# Patient Record
Sex: Female | Born: 1995 | Race: Black or African American | Hispanic: No | Marital: Single | Smoking: Never smoker
Health system: Southern US, Community
[De-identification: ages and names within clinical notes are randomized; demographics above are authoritative.]

---
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
## 2014-02-24 ENCOUNTER — Encounter (HOSPITAL_COMMUNITY): Payer: Self-pay | Admitting: *Deleted

## 2014-02-24 ENCOUNTER — Emergency Department (HOSPITAL_COMMUNITY)
Admission: EM | Admit: 2014-02-24 | Discharge: 2014-02-25 | Disposition: A | Discharge status: Home / Self Care | Payer: Medicaid Other | Attending: Emergency Medicine | Admitting: Emergency Medicine

## 2014-02-24 ENCOUNTER — Emergency Department (HOSPITAL_COMMUNITY): Payer: Medicaid Other

## 2014-02-24 DIAGNOSIS — R42 Dizziness and giddiness: Secondary | ICD-10-CM | POA: Insufficient documentation

## 2014-02-24 DIAGNOSIS — R000 Tachycardia, unspecified: Secondary | ICD-10-CM | POA: Insufficient documentation

## 2014-02-24 DIAGNOSIS — R05 Cough: Secondary | ICD-10-CM | POA: Diagnosis present

## 2014-02-24 DIAGNOSIS — R531 Weakness: Secondary | ICD-10-CM

## 2014-02-24 DIAGNOSIS — J069 Acute upper respiratory infection, unspecified: Secondary | ICD-10-CM | POA: Diagnosis not present

## 2014-02-24 LAB — CBC
HCT: 39.2 % (ref 36.0–46.0)
HEMOGLOBIN: 12.7 g/dL (ref 12.0–15.0)
MCH: 28.3 pg (ref 26.0–34.0)
MCHC: 32.4 g/dL (ref 30.0–36.0)
MCV: 87.5 fL (ref 78.0–100.0)
Platelets: 195 10*3/uL (ref 150–400)
RBC: 4.48 MIL/uL (ref 3.87–5.11)
RDW: 12.8 % (ref 11.5–15.5)
WBC: 5.5 10*3/uL (ref 4.0–10.5)

## 2014-02-24 NOTE — ED Provider Notes (Signed)
CSN: [id number]     Arrival date & time 02/24/14  2144 History   This chart was scribed for non-physician practitioner Elpidio Anis, PA-C, working with Purvis Sheffield, MD by Evon Slack, ED Scribe. This patient was seen in room WTR5/WTR5 and the patient's care was started at 10:50 PM.      Chief Complaint  Patient presents with  . Headache  . URI   The history is provided by the patient. No language interpreter was used.   HPI Comments: Melanie Mason is a 19 y.o. female who presents to the Emergency Department complaining of gradual pressure headache onset tonight. Pt states that for the past 3 weeks she has been feeling fatigues and dizzy when standing. Pt states she has also had cough, congestion and cold sweats for the past 3 weeks that have worsened over the past week. Pt states that she feels SOB after coughing for long periods of time descried as "cughing spell."  Pt states she has tried mucinex with no relief.   Denies ear pain, rhinorrhea, trouble swallowing, abdominal pain, n/v/d urinary symptoms.   History reviewed. No pertinent past medical history. History reviewed. No pertinent past surgical history. No family history on file. History  Substance Use Topics  . Smoking status: Never Smoker   . Smokeless tobacco: Not on file  . Alcohol Use: No   OB History    No data available     Review of Systems  Constitutional: Positive for fatigue.  HENT: Positive for congestion. Negative for rhinorrhea and trouble swallowing.   Respiratory: Positive for cough.   Gastrointestinal: Negative for nausea, vomiting, abdominal pain, diarrhea and constipation.  Neurological: Positive for dizziness and headaches.  All other systems reviewed and are negative.     Allergies  Review of patient's allergies indicates no known allergies.  Home Medications   Prior to Admission medications   Medication Sig Start Date End Date Taking? Authorizing Provider  etonogestrel (NEXPLANON)  68 MG IMPL implant 1 each by Subdermal route continuous.   Yes Historical Provider, MD   BP 130/87 mmHg  Pulse 110  Temp(Src) 99.5 F (37.5 C) (Oral)  Resp 18  SpO2 100%  LMP 02/18/2014   Physical Exam  Constitutional: She is oriented to person, place, and time. She appears well-developed and well-nourished. No distress.  HENT:  Head: Normocephalic and atraumatic.  Right Ear: Tympanic membrane normal. Tympanic membrane is not injected, not erythematous and not bulging.  Left Ear: Tympanic membrane normal. Tympanic membrane is not injected, not erythematous and not bulging.  Nose: Nose normal.  Mouth/Throat: Oropharynx is clear and moist. No oropharyngeal exudate, posterior oropharyngeal erythema or tonsillar abscesses.  Eyes: EOM are normal. Pupils are equal, round, and reactive to light. No scleral icterus.  Conjunctival pallor   Neck: Neck supple. No tracheal deviation present.  Cardiovascular: Regular rhythm.  Tachycardia present.  Exam reveals no gallop and no friction rub.   No murmur heard. Pulmonary/Chest: Effort normal and breath sounds normal. No respiratory distress. She has no wheezes. She has no rales.  Abdominal: Soft. There is no tenderness. There is no CVA tenderness.  Musculoskeletal: Normal range of motion.  Neurological: She is alert and oriented to person, place, and time. She has normal strength. No cranial nerve deficit or sensory deficit.  Cranial nerves 3-12 grossly intact.   Skin: Skin is warm and dry.  Psychiatric: She has a normal mood and affect. Her behavior is normal.  Nursing note and vitals reviewed.  ED Course  Procedures (including critical care time) DIAGNOSTIC STUDIES: Oxygen Saturation is 100% on RA, normal by my interpretation.    COORDINATION OF CARE: 11:01 PM-Discussed treatment plan with pt at bedside and pt agreed to plan.     Labs Review Labs Reviewed - No data to display  Imaging Review No results found.   EKG  Interpretation None      MDM   Final diagnoses:  None  1. URI 2. Generalized Weakness  The patient c/o weakness, positional lightheadedness without syncope, headache - symptoms for the past 3 weeks. Feeling progressively weaker. She is non-toxic in appearance. Will obtain orthostatics, hgb to r/o anemia.  Blood tests are reassuring. Orthostatic vitals obtained and patient asymptomatic with standing. VSS. She can be discharged home with PCP follow up for further management, referrals provided. Discussed URI symptoms and will treat with Z-pack given duration of symptoms.    I personally performed the services described in this documentation, which was scribed in my presence. The recorded information has been reviewed and is accurate.       Arnoldo HookerShari A Quintez Maselli, PA-C 02/25/14 [id number]  Purvis SheffieldForrest Harrison, MD 02/26/14 1409

## 2014-02-24 NOTE — ED Notes (Signed)
Pt reports h/a, "cold sweats", cough and chest congestion x 3 weeks.  Pt reports she's been feeling tired lately.

## 2014-02-25 LAB — BASIC METABOLIC PANEL
ANION GAP: 7 (ref 5–15)
BUN: 13 mg/dL (ref 6–23)
CO2: 25 mmol/L (ref 19–32)
Calcium: 9.5 mg/dL (ref 8.4–10.5)
Chloride: 109 mEq/L (ref 96–112)
Creatinine, Ser: 0.81 mg/dL (ref 0.50–1.10)
Glucose, Bld: 79 mg/dL (ref 70–99)
Potassium: 3.6 mmol/L (ref 3.5–5.1)
Sodium: 141 mmol/L (ref 135–145)

## 2014-02-25 MED ORDER — AZITHROMYCIN 250 MG PO TABS: 250.0000 mg | ORAL_TABLET | Freq: Every day | ORAL | Status: DC

## 2014-02-25 NOTE — Discharge Instructions (Signed)
Weakness Weakness is a lack of strength. It may be felt all over the body (generalized) or in one specific part of the body (focal). Some causes of weakness can be serious. You may need further medical evaluation, especially if you are elderly or you have a history of immunosuppression (such as chemotherapy or HIV), kidney disease, heart disease, or diabetes. CAUSES  Weakness can be caused by many different things, including:  Infection.  Physical exhaustion.  Internal bleeding or other blood loss that results in a lack of red blood cells (anemia).  Dehydration. This cause is more common in elderly people.  Side effects or electrolyte abnormalities from medicines, such as pain medicines or sedatives.  Emotional distress, anxiety, or depression.  Circulation problems, especially severe peripheral arterial disease.  Heart disease, such as rapid atrial fibrillation, bradycardia, or heart failure.  Nervous system disorders, such as Guillain-Barr syndrome, multiple sclerosis, or stroke. DIAGNOSIS  To find the cause of your weakness, your caregiver will take your history and perform a physical exam. Lab tests or X-rays may also be ordered, if needed. TREATMENT  Treatment of weakness depends on the cause of your symptoms and can vary greatly. HOME CARE INSTRUCTIONS   Rest as needed.  Eat a well-balanced diet.  Try to get some exercise every day.  Only take over-the-counter or prescription medicines as directed by your caregiver. SEEK MEDICAL CARE IF:   Your weakness seems to be getting worse or spreads to other parts of your body.  You develop new aches or pains. SEEK IMMEDIATE MEDICAL CARE IF:   You cannot perform your normal daily activities, such as getting dressed and feeding yourself.  You cannot walk up and down stairs, or you feel exhausted when you do so.  You have shortness of breath or chest pain.  You have difficulty moving parts of your body.  You have weakness  in only one area of the body or on only one side of the body.  You have a fever.  You have trouble speaking or swallowing.  You cannot control your bladder or bowel movements.  You have black or bloody vomit or stools. MAKE SURE YOU:  Understand these instructions.  Will watch your condition.  Will get help right away if you are not doing well or get worse. Document Released: 01/27/2005 Document Revised: 07/29/2011 Document Reviewed: 03/28/2011 [facility] Patient Information 2015 [place], [place]. This information is not intended to replace advice given to you by your health care provider. Make sure you discuss any questions you have with your health care provider. Upper Respiratory Infection, Adult An upper respiratory infection (URI) is also sometimes known as the common cold. The upper respiratory tract includes the nose, sinuses, throat, trachea, and bronchi. Bronchi are the airways leading to the lungs. Most people improve within 1 week, but symptoms can last up to 2 weeks. A residual cough may last even longer.  CAUSES Many different viruses can infect the tissues lining the upper respiratory tract. The tissues become irritated and inflamed and often become very moist. Mucus production is also common. A cold is contagious. You can easily spread the virus to others by oral contact. This includes kissing, sharing a glass, coughing, or sneezing. Touching your mouth or nose and then touching a surface, which is then touched by another person, can also spread the virus. SYMPTOMS  Symptoms typically develop 1 to 3 days after you come in contact with a cold virus. Symptoms vary from person to person. They may include:  Runny nose.  Sneezing.  Nasal congestion.  Sinus irritation.  Sore throat.  Loss of voice (laryngitis).  Cough.  Fatigue.  Muscle aches.  Loss of appetite.  Headache.  Low-grade fever. DIAGNOSIS  You might diagnose your own cold based on familiar symptoms,  since most people get a cold 2 to 3 times a year. Your caregiver can confirm this based on your exam. Most importantly, your caregiver can check that your symptoms are not due to another disease such as strep throat, sinusitis, pneumonia, asthma, or epiglottitis. Blood tests, throat tests, and X-rays are not necessary to diagnose a common cold, but they may sometimes be helpful in excluding other more serious diseases. Your caregiver will decide if any further tests are required. RISKS AND COMPLICATIONS  You may be at risk for a more severe case of the common cold if you smoke cigarettes, have chronic heart disease (such as heart failure) or lung disease (such as asthma), or if you have a weakened immune system. The very young and very old are also at risk for more serious infections. Bacterial sinusitis, middle ear infections, and bacterial pneumonia can complicate the common cold. The common cold can worsen asthma and chronic obstructive pulmonary disease (COPD). Sometimes, these complications can require emergency medical care and may be life-threatening. PREVENTION  The best way to protect against getting a cold is to practice good hygiene. Avoid oral or hand contact with people with cold symptoms. Wash your hands often if contact occurs. There is no clear evidence that vitamin C, vitamin E, echinacea, or exercise reduces the chance of developing a cold. However, it is always recommended to get plenty of rest and practice good nutrition. TREATMENT  Treatment is directed at relieving symptoms. There is no cure. Antibiotics are not effective, because the infection is caused by a virus, not by bacteria. Treatment may include:  Increased fluid intake. Sports drinks offer valuable electrolytes, sugars, and fluids.  Breathing heated mist or steam (vaporizer or shower).  Eating chicken soup or other clear broths, and maintaining good nutrition.  Getting plenty of rest.  Using gargles or lozenges for  comfort.  Controlling fevers with ibuprofen or acetaminophen as directed by your caregiver.  Increasing usage of your inhaler if you have asthma. Zinc gel and zinc lozenges, taken in the first 24 hours of the common cold, can shorten the duration and lessen the severity of symptoms. Pain medicines may help with fever, muscle aches, and throat pain. A variety of non-prescription medicines are available to treat congestion and runny nose. Your caregiver can make recommendations and may suggest nasal or lung inhalers for other symptoms.  HOME CARE INSTRUCTIONS   Only take over-the-counter or prescription medicines for pain, discomfort, or fever as directed by your caregiver.  Use a warm mist humidifier or inhale steam from a shower to increase air moisture. This may keep secretions moist and make it easier to breathe.  Drink enough water and fluids to keep your urine clear or pale yellow.  Rest as needed.  Return to work when your temperature has returned to normal or as your caregiver advises. You may need to stay home longer to avoid infecting others. You can also use a face mask and careful hand washing to prevent spread of the virus. SEEK MEDICAL CARE IF:   After the first few days, you feel you are getting worse rather than better.  You need your caregiver's advice about medicines to control symptoms.  You develop chills, worsening shortness  of breath, or brown or red sputum. These may be signs of pneumonia.  You develop yellow or brown nasal discharge or pain in the face, especially when you bend forward. These may be signs of sinusitis.  You develop a fever, swollen neck glands, pain with swallowing, or Upson areas in the back of your throat. These may be signs of strep throat. SEEK IMMEDIATE MEDICAL CARE IF:   You have a fever.  You develop severe or persistent headache, ear pain, sinus pain, or chest pain.  You develop wheezing, a prolonged cough, cough up blood, or have a  change in your usual mucus (if you have chronic lung disease).  You develop sore muscles or a stiff neck. Document Released: 07/23/2000 Document Revised: 04/21/2011 Document Reviewed: 05/04/2013 [facility] Patient Information 2015 [place], [place]. This information is not intended to replace advice given to you by your health care provider. Make sure you discuss any questions you have with your health care provider.

## 2015-04-21 ENCOUNTER — Emergency Department (HOSPITAL_COMMUNITY): Payer: Medicaid Other

## 2015-04-21 ENCOUNTER — Encounter (HOSPITAL_COMMUNITY): Payer: Self-pay | Admitting: *Deleted

## 2015-04-21 ENCOUNTER — Emergency Department (HOSPITAL_COMMUNITY)
Admission: EM | Admit: 2015-04-21 | Discharge: 2015-04-21 | Disposition: A | Discharge status: Home / Self Care | Payer: Medicaid Other | Attending: Emergency Medicine | Admitting: Emergency Medicine

## 2015-04-21 DIAGNOSIS — Y998 Other external cause status: Secondary | ICD-10-CM | POA: Insufficient documentation

## 2015-04-21 DIAGNOSIS — S46911A Strain of unspecified muscle, fascia and tendon at shoulder and upper arm level, right arm, initial encounter: Secondary | ICD-10-CM | POA: Insufficient documentation

## 2015-04-21 DIAGNOSIS — Y9389 Activity, other specified: Secondary | ICD-10-CM | POA: Diagnosis not present

## 2015-04-21 DIAGNOSIS — S63501A Unspecified sprain of right wrist, initial encounter: Secondary | ICD-10-CM | POA: Insufficient documentation

## 2015-04-21 DIAGNOSIS — S4991XA Unspecified injury of right shoulder and upper arm, initial encounter: Secondary | ICD-10-CM | POA: Diagnosis present

## 2015-04-21 DIAGNOSIS — Y92410 Unspecified street and highway as the place of occurrence of the external cause: Secondary | ICD-10-CM | POA: Insufficient documentation

## 2015-04-21 MED ORDER — IBUPROFEN 100 MG/5ML PO SUSP: 600.0000 mg | Freq: Four times a day (QID) | ORAL | Status: DC | PRN

## 2015-04-21 MED ORDER — IBUPROFEN 100 MG/5ML PO SUSP
600.0000 mg | Freq: Once | ORAL | Status: AC
  Administered 2015-04-21: 600 mg via ORAL
  Filled 2015-04-21: qty 30

## 2015-04-21 MED ORDER — DIAZEPAM 1 MG/ML PO SOLN: 5.0000 mg | Freq: Two times a day (BID) | ORAL | Status: DC | PRN
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
## 2017-07-13 ENCOUNTER — Other Ambulatory Visit (HOSPITAL_COMMUNITY)
Admission: RE | Admit: 2017-07-13 | Discharge: 2017-07-13 | Disposition: A | Discharge status: Home / Self Care | Payer: Managed Care, Other (non HMO) | Source: Ambulatory Visit | Attending: Obstetrics and Gynecology | Admitting: Obstetrics and Gynecology

## 2017-07-13 ENCOUNTER — Other Ambulatory Visit: Payer: Self-pay | Admitting: Obstetrics and Gynecology

## 2017-07-13 DIAGNOSIS — Z124 Encounter for screening for malignant neoplasm of cervix: Secondary | ICD-10-CM | POA: Insufficient documentation

## 2017-07-14 LAB — CYTOLOGY - PAP
Chlamydia: NEGATIVE
DIAGNOSIS: NEGATIVE
Neisseria Gonorrhea: NEGATIVE

## 2017-07-16 ENCOUNTER — Other Ambulatory Visit: Payer: Self-pay

## 2017-07-16 ENCOUNTER — Ambulatory Visit (INDEPENDENT_AMBULATORY_CARE_PROVIDER_SITE_OTHER): Payer: Managed Care, Other (non HMO) | Admitting: Family Medicine

## 2017-07-16 VITALS — BP 118/64 | HR 85 | Temp 99.0°F | Ht 64.0 in | Wt 126.0 lb

## 2017-07-16 DIAGNOSIS — L74510 Primary focal hyperhidrosis, axilla: Secondary | ICD-10-CM

## 2017-07-16 MED ORDER — ALUMINUM CHLORIDE 20 % EX SOLN: Freq: Every day | CUTANEOUS | 0 refills | Status: AC

## 2017-07-16 NOTE — Patient Instructions (Addendum)
Hyperhidrosis: Topical: Apply once daily at bedtime; once excessive sweating has stopped, may decrease to once or twice weekly, or as needed. Wash treated area in the morning.     IF you received an x-ray today, you will receive an invoice from [facility]. Please contact [facility] at [phone] with questions or concerns regarding your invoice.   IF you received labwork today, you will receive an invoice from [facility]. Please contact [facility] at [phone] with questions or concerns regarding your invoice.   Our billing staff will not be able to assist you with questions regarding bills from these companies.  You will be contacted with the lab results as soon as they are available. The fastest way to get your results is to activate your My Chart account. Instructions are located on the last page of this paperwork. If you have not heard from us regarding the results in 2 weeks, please contact this office.

## 2017-07-16 NOTE — Progress Notes (Signed)
Chief Complaint  Patient presents with  . Establish Care    looking to est care. Having excessive sweating for years. Having cosmetic surgery in 7 mos for the breast will need medical clearance as well    HPI  She will be having breast augmentation in 7 months She will need medical clearance at that time  She has a history of hyperhidrosis of the armpit She reports that this has been ongoing for years She has tried mitchum and certain dry roll ons which have not helped She does not associate this with stress or anxiety She reports that this has been since puberty She feels self conscious about it     4 review of systems  No past medical history on file.  Current Outpatient Medications  Medication Sig Dispense Refill  . aluminum chloride (DRYSOL) 20 % external solution Apply topically at bedtime. Until sweating decreases then as needed 35 mL 0   No current facility-administered medications for this visit.     Allergies: No Known Allergies  No past surgical history on file.  Social History   Socioeconomic History  . Marital status: Single    Spouse name: Not on file  . Number of children: Not on file  . Years of education: Not on file  . Highest education level: Not on file  Occupational History  . Not on file  Social Needs  . Financial resource strain: Not on file  . Food insecurity:    Worry: Not on file    Inability: Not on file  . Transportation needs:    Medical: Not on file    Non-medical: Not on file  Tobacco Use  . Smoking status: Never Smoker  Substance and Sexual Activity  . Alcohol use: No  . Drug use: No  . Sexual activity: Not on file  Lifestyle  . Physical activity:    Days per week: Not on file    Minutes per session: Not on file  . Stress: Not on file  Relationships  . Social connections:    Talks on phone: Not on file    Gets together: Not on file    Attends religious service: Not on file    Active member of club or organization:  Not on file    Attends meetings of clubs or organizations: Not on file    Relationship status: Not on file  Other Topics Concern  . Not on file  Social History Narrative  . Not on file    No family history on file.   ROS Review of Systems See HPI Constitution: No fevers or chills No malaise No diaphoresis Skin: No rash or itching Eyes: no blurry vision, no double vision GU: no dysuria or hematuria Neuro: no dizziness or headaches all others reviewed and negative   Objective: Vitals:   07/16/17 1027  BP: 118/64  Pulse: 85  Temp: 99 F (37.2 C)  TempSrc: Oral  SpO2: 99%  Weight: 126 lb (57.2 kg)  Height: 5\' 4"  (1.626 m)    Physical Exam  Constitutional: She is oriented to person, place, and time. She appears well-developed and well-nourished.  HENT:  Head: Normocephalic and atraumatic.  Eyes: Conjunctivae and EOM are normal.  Neck: Normal range of motion. Neck supple. No thyromegaly present.  Cardiovascular: Normal rate, regular rhythm and normal heart sounds.  No murmur heard. Pulmonary/Chest: Effort normal and breath sounds normal. No stridor. No respiratory distress. She has no wheezes. She has no rales.  Neurological: She is  alert and oriented to person, place, and time.  Skin: Skin is warm. Capillary refill takes less than 2 seconds.  Psychiatric: She has a normal mood and affect. Her behavior is normal. Judgment and thought content normal.    Assessment and Plan Stellar was seen today for establish care.  Diagnoses and all orders for this visit:  Hyperhidrosis of axilla -     aluminum chloride (DRYSOL) 20 % external solution; Apply topically at bedtime. Until sweating decreases then as needed    Hyperhidrosis: Topical: Apply once daily at bedtime; once excessive sweating has stopped, may decrease to once or twice weekly, or as needed. Wash treated area in the morning.  Tawana Pasch A Jahnessa Vanduyn

## 2017-11-13 ENCOUNTER — Other Ambulatory Visit: Payer: Self-pay

## 2017-11-13 ENCOUNTER — Ambulatory Visit: Payer: Self-pay | Admitting: Family Medicine

## 2017-11-13 ENCOUNTER — Encounter: Payer: Self-pay | Admitting: Family Medicine

## 2017-11-13 VITALS — BP 122/78 | HR 79 | Temp 98.8°F | Ht 63.5 in | Wt 127.2 lb

## 2017-11-13 DIAGNOSIS — R002 Palpitations: Secondary | ICD-10-CM

## 2017-11-13 DIAGNOSIS — R110 Nausea: Secondary | ICD-10-CM

## 2017-11-13 DIAGNOSIS — K30 Functional dyspepsia: Secondary | ICD-10-CM

## 2017-11-13 DIAGNOSIS — Z8249 Family history of ischemic heart disease and other diseases of the circulatory system: Secondary | ICD-10-CM

## 2017-11-13 DIAGNOSIS — Z30013 Encounter for initial prescription of injectable contraceptive: Secondary | ICD-10-CM

## 2017-11-13 DIAGNOSIS — R197 Diarrhea, unspecified: Secondary | ICD-10-CM

## 2017-11-13 DIAGNOSIS — R1030 Lower abdominal pain, unspecified: Secondary | ICD-10-CM

## 2017-11-13 LAB — POCT URINALYSIS DIP (MANUAL ENTRY)
Bilirubin, UA: NEGATIVE
Blood, UA: NEGATIVE
Glucose, UA: NEGATIVE mg/dL
Ketones, POC UA: NEGATIVE mg/dL
Leukocytes, UA: NEGATIVE
Nitrite, UA: NEGATIVE
Protein Ur, POC: NEGATIVE mg/dL
Spec Grav, UA: 1.015 (ref 1.010–1.025)
Urobilinogen, UA: 0.2 E.U./dL
pH, UA: 6 (ref 5.0–8.0)

## 2017-11-13 LAB — POCT URINE PREGNANCY: Preg Test, Ur: NEGATIVE

## 2017-11-13 MED ORDER — MEDROXYPROGESTERONE ACETATE 150 MG/ML IM SUSP
150.0000 mg | Freq: Once | INTRAMUSCULAR | Status: AC
  Administered 2017-11-13: 150 mg via INTRAMUSCULAR

## 2017-11-13 MED ORDER — OMEPRAZOLE 20 MG PO CPDR: 20.0000 mg | DELAYED_RELEASE_CAPSULE | Freq: Two times a day (BID) | ORAL | 0 refills | Status: DC

## 2017-11-13 NOTE — Progress Notes (Signed)
10/4/20199:26 AM  Melanie Mason Aug 03, 1995, 22 y.o. female [id number]  Chief Complaint  Patient presents with  . Pain    pain in the chest, more like pressure, sob, nausea after she eats and drinks, tachycardia    HPI:   Patient is a 22 y.o. female who presents today for palpittaions  She is having racing heartbeat, very prominent only after she eats SOB, nausea, chest pain, light headed, diaphoretic X 2 weeks Symptoms more severe is she eats something fatty Escalating, now having immediate severe stomach cramps and bowel movements Has a constant feeling upset stomach Sometimes bright red blood in the tissue, no black tarry stools No changes with diet modifications Has not tried any medications No fever, no vomiting, no bloating or belching, heartburn, cough, jaundice, dysphagia Endorses gassiness, good appetite  Mild acid brash started days prior to having palpitations and assoc sx after eating  No recent travels, camping, sick contacts, supplements, sign NSAID use No caffeine, does not smoke It reminds her when she had a stomach flu time several years ago  She does not have palpitations when physically active  Does not know if she has immunizations for hep a or b  Works at [employer]  No fhx thyroid disorder Mother has had 2 MI at 64 and CVA, heart surgery, h/o VTE, mom + smoker, OCPs Maternal grandmother also has had VTE  On OCPs, currently on day 4 of placebo pills  Fall Risk  11/13/2017 07/16/2017  Falls in the past year? No No     Depression screen [facility] 2/9 11/13/2017 07/16/2017  Decreased Interest 0 0  Down, Depressed, Hopeless 0 0  PHQ - 2 Score 0 0    No Known Allergies  Prior to Admission medications   Medication Sig Start Date End Date Taking? Authorizing Provider  aluminum chloride (DRYSOL) 20 % external solution Apply topically at bedtime. Until sweating decreases then as needed 07/16/17   Doristine Bosworth, MD    History reviewed. No pertinent  past medical history.  History reviewed. No pertinent surgical history.  Social History   Tobacco Use  . Smoking status: Never Smoker  . Smokeless tobacco: Never Used  Substance Use Topics  . Alcohol use: No    Family History  Problem Relation Age of Onset  . Stroke Mother   . Hypertension Mother   . Heart disease Mother   . Healthy Father   . Healthy Sister   . Healthy Brother     ROS Per hpi  OBJECTIVE:  Blood pressure 122/78, pulse 79, temperature 98.8 F (37.1 C), temperature source Oral, height 5' 3.5" (1.613 m), weight 127 lb 3.2 oz (57.7 kg), SpO2 100 %. Body mass index is 22.18 kg/m.   Physical Exam  Constitutional: She is oriented to person, place, and time. She appears well-developed and well-nourished.  HENT:  Head: Normocephalic and atraumatic.  Right Ear: Hearing, tympanic membrane, external ear and ear canal normal.  Left Ear: Hearing, tympanic membrane, external ear and ear canal normal.  Mouth/Throat: Oropharynx is clear and moist.  Eyes: Pupils are equal, round, and reactive to light. Conjunctivae and EOM are normal.  Neck: Neck supple. No thyromegaly present.  Cardiovascular: Normal rate, regular rhythm, normal heart sounds and intact distal pulses. Exam reveals no gallop and no friction rub.  No murmur heard. Pulmonary/Chest: Effort normal and breath sounds normal. She has no wheezes. She has no rales.  Abdominal: Soft. Bowel sounds are normal. She exhibits no distension and  no mass. There is no tenderness.  Musculoskeletal: Normal range of motion. She exhibits no edema.  Lymphadenopathy:    She has no cervical adenopathy.  Neurological: She is alert and oriented to person, place, and time. She has normal reflexes. No cranial nerve deficit. Gait normal.  Skin: Skin is warm and dry.  Psychiatric: She has a normal mood and affect.  Nursing note and vitals reviewed.  My interpretation of EKG:  NSR, HR 80, PR interval 116  Results for orders  placed or performed in visit on 11/13/17 (from the past 24 hour(s))  POCT urinalysis dipstick     Status: None   Collection Time: 11/13/17 10:25 AM  Result Value Ref Range   Color, UA yellow yellow   Clarity, UA clear clear   Glucose, UA negative negative mg/dL   Bilirubin, UA negative negative   Ketones, POC UA negative negative mg/dL   Spec Grav, UA 4.098 1.191 - 1.025   Blood, UA negative negative   pH, UA 6.0 5.0 - 8.0   Protein Ur, POC negative negative mg/dL   Urobilinogen, UA 0.2 0.2 or 1.0 E.U./dL   Nitrite, UA Negative Negative   Leukocytes, UA Negative Negative  POCT urine pregnancy     Status: None   Collection Time: 11/13/17 10:25 AM  Result Value Ref Range   Preg Test, Ur Negative Negative    ASSESSMENT and PLAN  1. Palpitations Post parandial,  associated with SOB and presyncope,  Vasovagal? Labs to r/o secondary causes. Mother with MI in her 53s, referring to  Cards for further eval and treat. RTC precautions reviewed - CBC with Differential/Platelet - Comprehensive metabolic panel - TSH - EKG 12-Lead - Ambulatory referral to Cardiology  2. Nausea without vomiting - CBC with Differential/Platelet - Comprehensive metabolic panel  3. Lower abdominal pain - CBC with Differential/Platelet - Comprehensive metabolic panel - TSH  4. Diarrhea, unspecified type - CBC with Differential/Platelet - Comprehensive metabolic panel - TSH  5. Acid indigestion Discussed with patient that sx suggestive of GI cause. Checking labs to eval secondary causes, treating empirically with PPI.  - H. pylori breath test  6. Encounter for initial prescription of injectable contraceptive Strong fhx of VTE and early ASCVD, discussed risk of OCPs, discussed options r/se/b. Transitioning to depo. Discussed need for backup method for next 7 days. Next depo due Dec 20- Jan 3 - POCT urinalysis dipstick - medroxyPROGESTERone (DEPO-PROVERA) injection 150 mg - POCT urine pregnancy  7.  FHx: early MI - EKG 12-Lead - Ambulatory referral to Cardiology  Other orders - omeprazole (PRILOSEC) 20 MG capsule; Take 1 capsule (20 mg total) by mouth 2 (two) times daily before a meal.  Return in about 2 weeks (around 11/27/2017).    Myles Lipps, MD Primary Care at [facility] [address] [place], [place] [address] Ph.  [phone] Fax [phone]

## 2017-11-13 NOTE — Patient Instructions (Addendum)
Next depo Dec 20 - Jan 3, nurse visit ok    If you have lab work done today you will be contacted with your lab results within the next 2 weeks.  If you have not heard from [place] then please contact us. The fastest way to get your results is to register for My Chart.   IF you received an x-ray today, you will receive an invoice from [facility]. Please contact [facility] at [phone] with questions or concerns regarding your invoice.   IF you received labwork today, you will receive an invoice from [place]. Please contact [facility] at [phone] with questions or concerns regarding your invoice.   Our billing staff will not be able to assist you with questions regarding bills from these companies.  You will be contacted with the lab results as soon as they are available. The fastest way to get your results is to activate your My Chart account. Instructions are located on the last page of this paperwork. If you have not heard from [place] regarding the results in 2 weeks, please contact this office.

## 2017-11-14 LAB — COMPREHENSIVE METABOLIC PANEL
ALT: 10 IU/L (ref 0–32)
AST: 17 IU/L (ref 0–40)
Albumin/Globulin Ratio: 1.5 (ref 1.2–2.2)
Albumin: 4.4 g/dL (ref 3.5–5.5)
Alkaline Phosphatase: 34 IU/L — ABNORMAL LOW (ref 39–117)
BUN/Creatinine Ratio: 9 (ref 9–23)
BUN: 8 mg/dL (ref 6–20)
Bilirubin Total: <0.2 mg/dL (ref 0.0–1.2)
CO2: 23 mmol/L (ref 20–29)
Calcium: 9.5 mg/dL (ref 8.7–10.2)
Chloride: 103 mmol/L (ref 96–106)
Creatinine, Ser: 0.9 mg/dL (ref 0.57–1.00)
GFR calc Af Amer: 104 mL/min/{1.73_m2} (ref >59)
GFR calc non Af Amer: 90 mL/min/{1.73_m2} (ref >59)
Globulin, Total: 3 g/dL (ref 1.5–4.5)
Glucose: 100 mg/dL — ABNORMAL HIGH (ref 65–99)
Potassium: 3.9 mmol/L (ref 3.5–5.2)
Sodium: 141 mmol/L (ref 134–144)
Total Protein: 7.4 g/dL (ref 6.0–8.5)

## 2017-11-14 LAB — CBC WITH DIFFERENTIAL/PLATELET
Basophils Absolute: 0 10*3/uL (ref 0.0–0.2)
Basos: 1 %
EOS (ABSOLUTE): 0.1 10*3/uL (ref 0.0–0.4)
Eos: 3 %
Hematocrit: 39.6 % (ref 34.0–46.6)
Hemoglobin: 13.4 g/dL (ref 11.1–15.9)
Immature Grans (Abs): 0 10*3/uL (ref 0.0–0.1)
Immature Granulocytes: 0 %
Lymphocytes Absolute: 1.8 10*3/uL (ref 0.7–3.1)
Lymphs: 45 %
MCH: 28.3 pg (ref 26.6–33.0)
MCHC: 33.8 g/dL (ref 31.5–35.7)
MCV: 84 fL (ref 79–97)
Monocytes Absolute: 0.4 10*3/uL (ref 0.1–0.9)
Monocytes: 10 %
Neutrophils Absolute: 1.6 10*3/uL (ref 1.4–7.0)
Neutrophils: 41 %
Platelets: 227 10*3/uL (ref 150–450)
RBC: 4.73 x10E6/uL (ref 3.77–5.28)
RDW: 12.6 % (ref 12.3–15.4)
WBC: 4 10*3/uL (ref 3.4–10.8)

## 2017-11-14 LAB — TSH: TSH: 2.71 u[IU]/mL (ref 0.450–4.500)

## 2017-11-15 LAB — H. PYLORI BREATH COLLECTION

## 2017-11-15 LAB — H. PYLORI BREATH TEST: H pylori Breath Test: NEGATIVE

## 2017-11-21 ENCOUNTER — Encounter (HOSPITAL_COMMUNITY): Payer: Self-pay | Admitting: Emergency Medical Services

## 2017-11-21 ENCOUNTER — Emergency Department (HOSPITAL_COMMUNITY)
Admission: EM | Admit: 2017-11-21 | Discharge: 2017-11-21 | Disposition: A | Discharge status: Home / Self Care | Payer: Self-pay | Attending: Emergency Medicine | Admitting: Emergency Medicine

## 2017-11-21 ENCOUNTER — Emergency Department (HOSPITAL_COMMUNITY): Payer: Self-pay

## 2017-11-21 ENCOUNTER — Other Ambulatory Visit: Payer: Self-pay

## 2017-11-21 DIAGNOSIS — M545 Low back pain, unspecified: Secondary | ICD-10-CM

## 2017-11-21 DIAGNOSIS — G8929 Other chronic pain: Secondary | ICD-10-CM

## 2017-11-21 LAB — PREGNANCY, URINE: Preg Test, Ur: NEGATIVE

## 2017-11-21 MED ORDER — METHOCARBAMOL 500 MG PO TABS: 500.0000 mg | ORAL_TABLET | Freq: Two times a day (BID) | ORAL | 0 refills | Status: AC

## 2017-11-21 MED ORDER — ACETAMINOPHEN 500 MG PO TABS
1000.0000 mg | ORAL_TABLET | Freq: Once | ORAL | Status: AC
  Administered 2017-11-21: 1000 mg via ORAL
  Filled 2017-11-21: qty 2
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
## 2017-11-21 NOTE — Discharge Instructions (Signed)
The x-ray of your lower back was reassuring.  Please take 600 mg ibuprofen every 6 hours as needed for pain.  He can take at thousand milligrams of Tylenol twice a day.  I written you prescription for muscle relaxer, remember that this medicine can make you drowsy so please do not drive, work or drink alcohol while taking it.  Apply heat to the lower back to help with your symptoms.  You can engage in stretching exercises to help with your symptoms.  Return to the ER if you have any new or concerning symptoms like loss of sensation in your feet or legs, weakness making it challenging to walk, loss of bowel or bladder control, fever.

## 2017-11-23 ENCOUNTER — Encounter: Payer: Self-pay | Admitting: Family Medicine

## 2017-12-06 ENCOUNTER — Other Ambulatory Visit: Payer: Self-pay | Admitting: Family Medicine

## 2017-12-07 NOTE — Telephone Encounter (Signed)
Protocol met.  

## 2017-12-25 ENCOUNTER — Ambulatory Visit: Payer: Self-pay | Admitting: Internal Medicine

## 2018-01-20 ENCOUNTER — Ambulatory Visit: Payer: Self-pay | Admitting: Internal Medicine

## 2018-01-20 NOTE — Progress Notes (Deleted)
Cardiology Office Note:    Date:  01/20/2018   ID:  Velvet Bathe, DOB [birth year], MRN [id number]  PCP:  Patient, No Pcp Per  Cardiologist:  No primary care provider on file.  Electrophysiologist:  None   Referring MD: Myles Lipps, MD   No chief complaint on file. ***  History of Present Illness:    Melanie Mason is a 22 y.o. female with a hx of ***  No past medical history on file.  No past surgical history on file.  Current Medications: No outpatient medications have been marked as taking for the 01/20/18 encounter (Appointment) with Parke Poisson, MD.     Allergies:   Patient has no known allergies.   Social History   Socioeconomic History  . Marital status: Single    Spouse name: Not on file  . Number of children: Not on file  . Years of education: Not on file  . Highest education level: Not on file  Occupational History  . Not on file  Social Needs  . Financial resource strain: Not on file  . Food insecurity:    Worry: Not on file    Inability: Not on file  . Transportation needs:    Medical: Not on file    Non-medical: Not on file  Tobacco Use  . Smoking status: Never Smoker  Substance and Sexual Activity  . Alcohol use: No  . Drug use: No  . Sexual activity: Yes  Lifestyle  . Physical activity:    Days per week: Not on file    Minutes per session: Not on file  . Stress: Not on file  Relationships  . Social connections:    Talks on phone: Not on file    Gets together: Not on file    Attends religious service: Not on file    Active member of club or organization: Not on file    Attends meetings of clubs or organizations: Not on file    Relationship status: Not on file  Other Topics Concern  . Not on file  Social History Narrative   ** Merged History Encounter **         Family History: The patient's ***family history includes Healthy in her brother, father, and sister; Heart disease in her mother; Hypertension in her mother;  Stroke in her mother.  ROS:   Please see the history of present illness.    All other systems reviewed and are negative.  EKGs/Labs/Other Studies Reviewed:    The following studies were reviewed today:  EKG:  ***  Recent Labs: 11/13/2017: ALT 10; BUN 8; Creatinine, Ser 0.90; Hemoglobin 13.4; Platelets 227; Potassium 3.9; Sodium 141; TSH 2.710  Recent Lipid Panel No results found for: CHOL, TRIG, HDL, CHOLHDL, VLDL, LDLCALC, LDLDIRECT  Physical Exam:    VS:  There were no vitals taken for this visit.    Wt Readings from Last 3 Encounters:  11/13/17 127 lb 3.2 oz (57.7 kg)  07/16/17 126 lb (57.2 kg)     Constitutional: No acute distress Eyes: pupils equally round and reactive to light, sclera non-icteric, normal conjunctiva and lids ENMT: normal dentition, moist mucous membranes Cardiovascular: regular rhythm, normal rate, no murmurs. S1 and S2 normal. Radial pulses normal bilaterally. No jugular venous distention.  Respiratory: clear to auscultation bilaterally GI : normal bowel sounds, soft and nontender. No distention.   MSK: extremities warm, well perfused. No edema.  NEURO: grossly nonfocal exam, moves all extremities. PSYCH: alert and oriented  x 3, normal mood and affect.      ASSESSMENT:    No diagnosis found. PLAN:     Medication Adjustments/Labs and Tests Ordered: Current medicines are reviewed at length with the patient today.  Concerns regarding medicines are outlined above.  No orders of the defined types were placed in this encounter.  No orders of the defined types were placed in this encounter.   There are no Patient Instructions on file for this visit.   Signed, Parke PoissonGayatri A Masha Orbach, MD  01/20/2018 1:05 PM    [facility]

## 2018-01-21 ENCOUNTER — Encounter: Payer: Self-pay | Admitting: *Deleted

## 2019-02-15 IMAGING — CR DG LUMBAR SPINE COMPLETE 4+V
5 series · 5 of 5 positions shown · non-contrast
Comparison: None.

CLINICAL DATA: Patient status post fall. Worsening lower back pain.

EXAM:
LUMBAR SPINE - COMPLETE 4+ VIEW

[t lumbar spine ap]
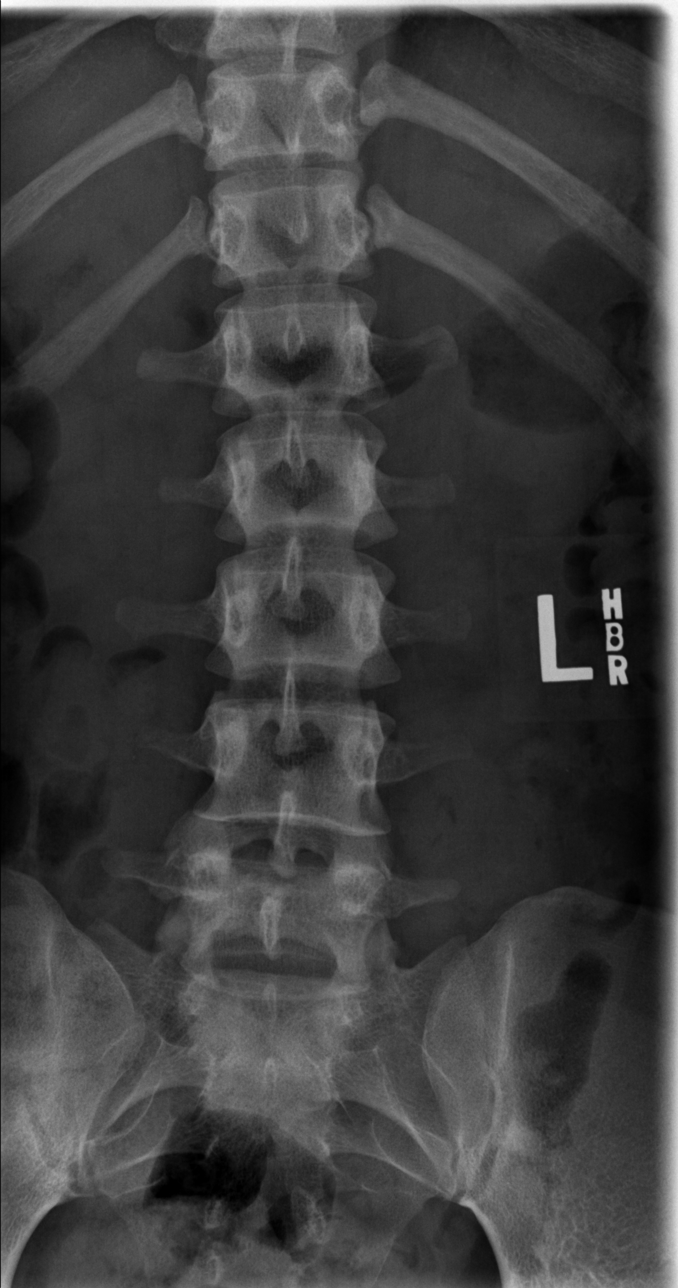

[t lumbar spine obl (1 of 2)]
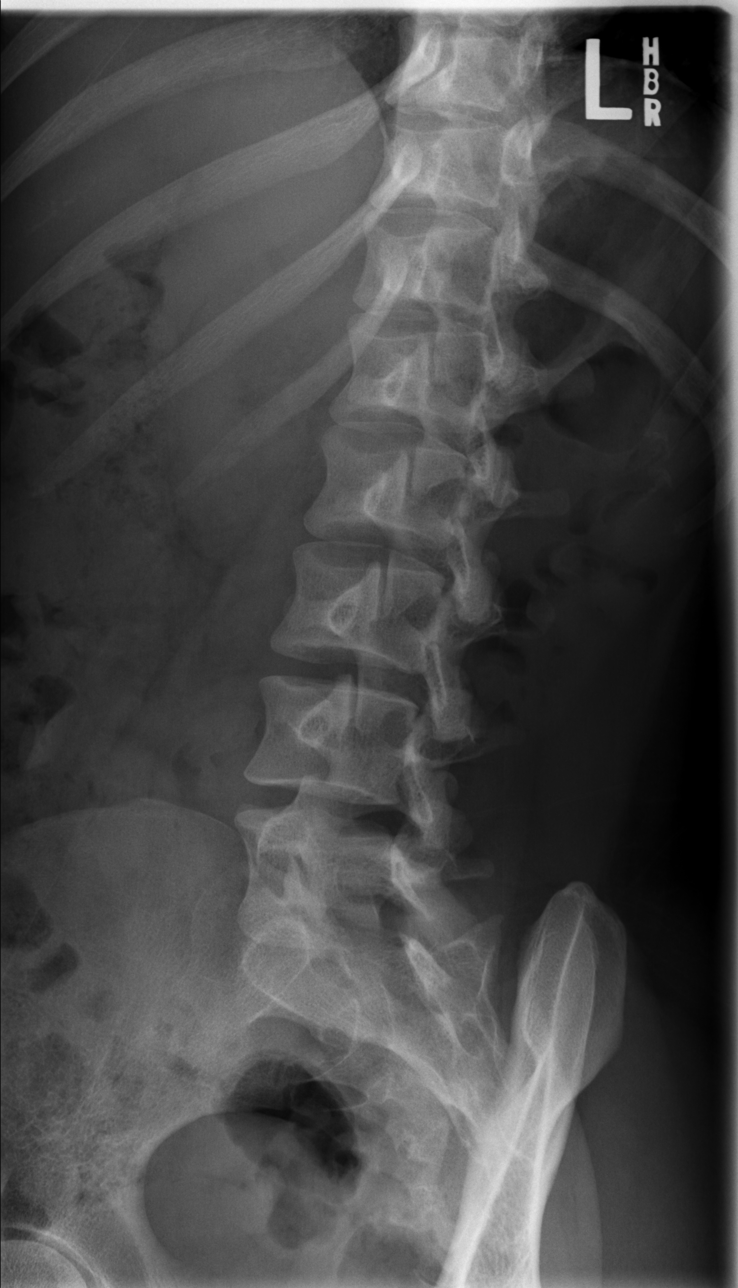

[t lumbar spine obl (2 of 2)]
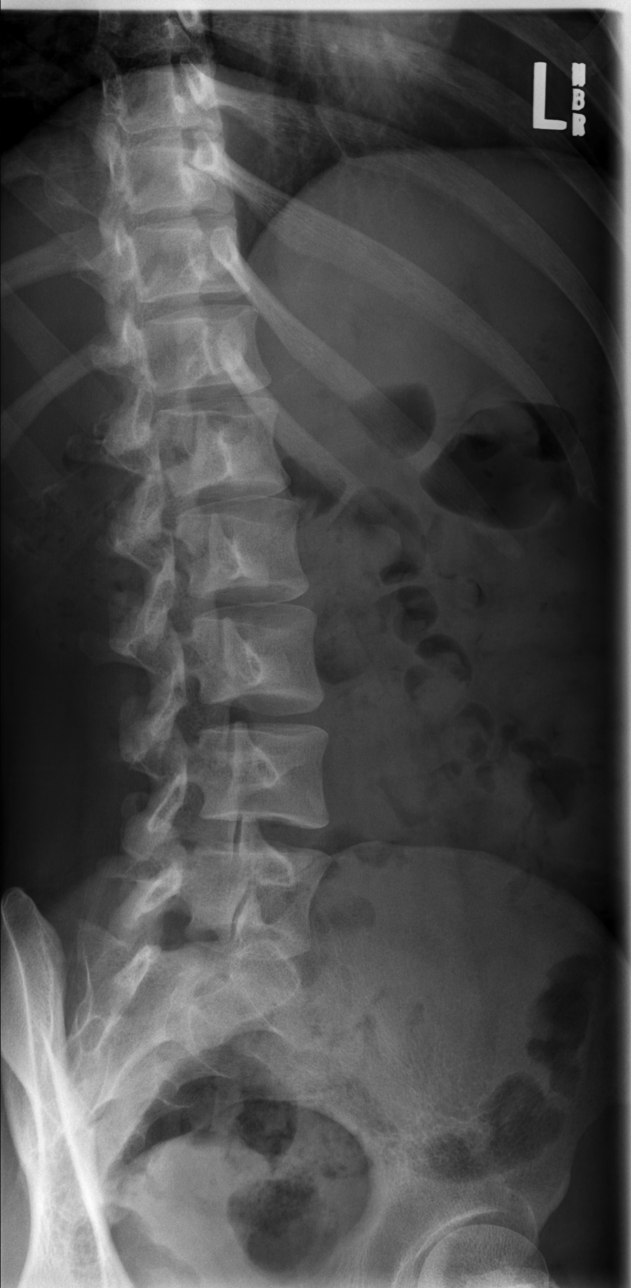

[t lumbar spine lat]
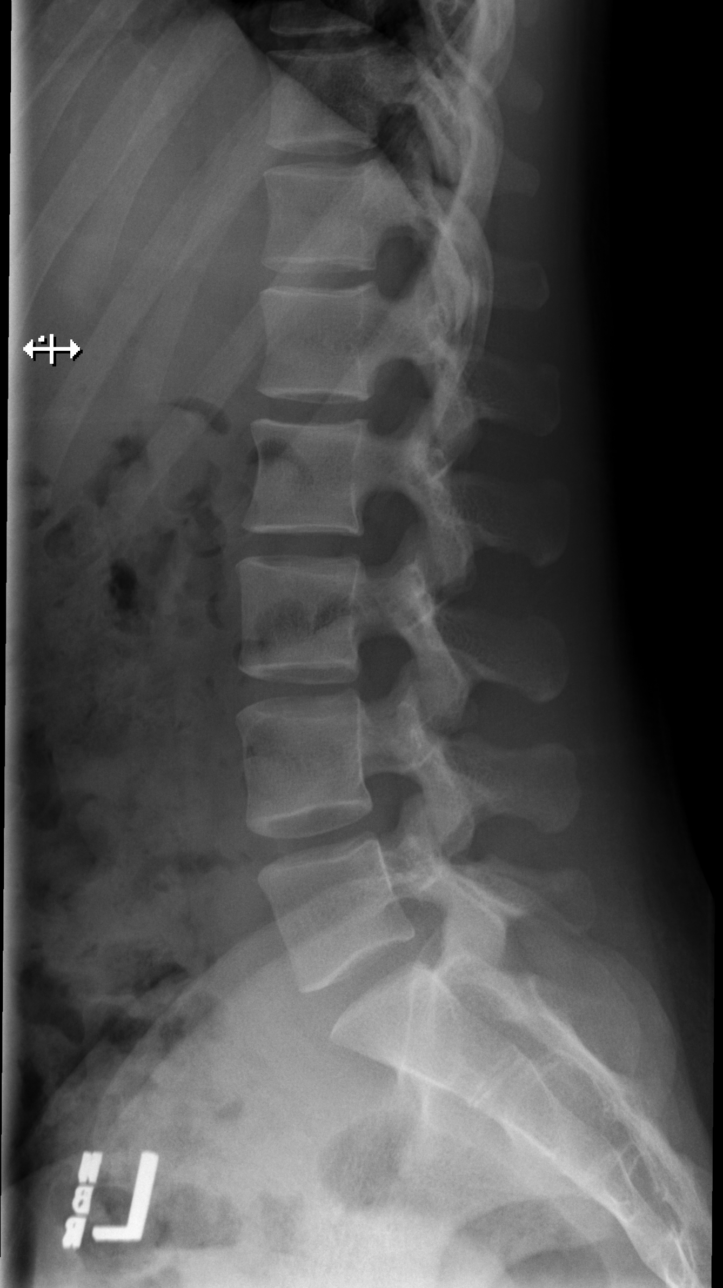

[t lumbar l-5 s-1 spot]
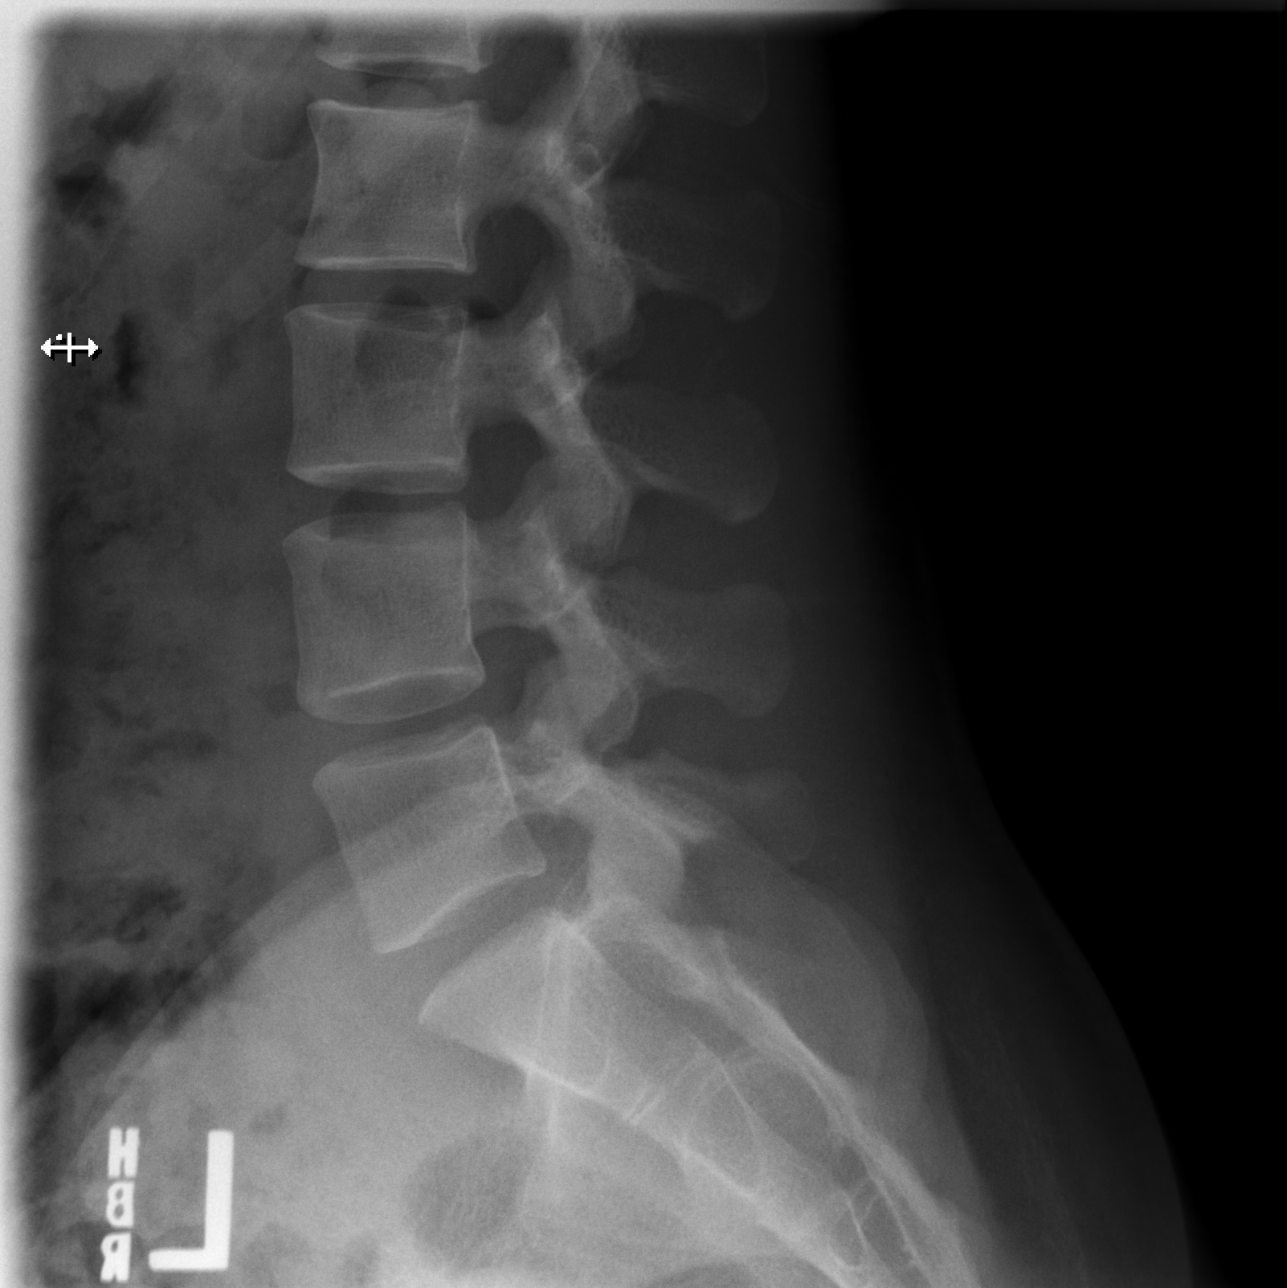

[5 of 5 positions shown; findings below may reference images not displayed]

FINDINGS: Normal anatomic alignment. No evidence for acute fracture or
dislocation. Relative preservation the vertebral body and
intervertebral disc space heights. SI joints are unremarkable.
IMPRESSION: No acute osseous abnormality.
